# Patient Record
Sex: Female | Born: 1985 | Race: Black or African American | Hispanic: No | Marital: Single | Smoking: Former smoker
Health system: Southern US, Community
[De-identification: ages and names within clinical notes are randomized; demographics above are authoritative.]

## PROBLEM LIST (undated history)

## (undated) HISTORY — PX: BREAST SURGERY: SHX581

---
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
## 2007-11-25 ENCOUNTER — Emergency Department: Payer: Self-pay | Admitting: Emergency Medicine

## 2010-07-05 ENCOUNTER — Emergency Department: Payer: Self-pay | Admitting: Emergency Medicine

## 2010-11-12 ENCOUNTER — Emergency Department: Payer: Self-pay | Admitting: Emergency Medicine

## 2011-04-18 ENCOUNTER — Observation Stay: Payer: Self-pay

## 2011-07-13 ENCOUNTER — Emergency Department: Payer: Self-pay | Admitting: *Deleted

## 2012-03-10 ENCOUNTER — Emergency Department: Payer: Self-pay | Admitting: Internal Medicine

## 2012-09-12 ENCOUNTER — Emergency Department: Payer: Self-pay

## 2012-09-12 LAB — URINALYSIS, COMPLETE
Bacteria: NONE SEEN
Bilirubin,UR: NEGATIVE
Blood: NEGATIVE
Ketone: NEGATIVE
Leukocyte Esterase: NEGATIVE
Ph: 6 (ref 4.5–8.0)
Protein: NEGATIVE
RBC,UR: 1 /HPF (ref 0–5)
Specific Gravity: 1.025 (ref 1.003–1.030)
Squamous Epithelial: 1
WBC UR: 1 /HPF (ref 0–5)

## 2012-09-12 LAB — COMPREHENSIVE METABOLIC PANEL
Albumin: 3.9 g/dL (ref 3.4–5.0)
Alkaline Phosphatase: 77 U/L (ref 50–136)
Anion Gap: 7 (ref 7–16)
BUN: 7 mg/dL (ref 7–18)
Bilirubin,Total: 0.3 mg/dL (ref 0.2–1.0)
Calcium, Total: 8.5 mg/dL (ref 8.5–10.1)
Chloride: 106 mmol/L (ref 98–107)
Co2: 26 mmol/L (ref 21–32)
Creatinine: 0.84 mg/dL (ref 0.60–1.30)
EGFR (African American): >60
EGFR (Non-African Amer.): >60
Glucose: 89 mg/dL (ref 65–99)
Osmolality: 275 (ref 275–301)
Potassium: 3.4 mmol/L — ABNORMAL LOW (ref 3.5–5.1)
SGOT(AST): 17 U/L (ref 15–37)
SGPT (ALT): 16 U/L (ref 12–78)
Sodium: 139 mmol/L (ref 136–145)
Total Protein: 7.8 g/dL (ref 6.4–8.2)

## 2012-09-12 LAB — WET PREP, GENITAL

## 2012-09-12 LAB — CBC
HCT: 35.7 % (ref 35.0–47.0)
HGB: 11.5 g/dL — ABNORMAL LOW (ref 12.0–16.0)
MCH: 27.1 pg (ref 26.0–34.0)
MCHC: 32.3 g/dL (ref 32.0–36.0)
MCV: 84 fL (ref 80–100)
Platelet: 294 10*3/uL (ref 150–440)
RBC: 4.25 10*6/uL (ref 3.80–5.20)
RDW: 13.1 % (ref 11.5–14.5)
WBC: 5.1 10*3/uL (ref 3.6–11.0)

## 2012-09-12 LAB — LIPASE, BLOOD: Lipase: 93 U/L (ref 73–393)

## 2012-11-05 ENCOUNTER — Emergency Department: Payer: Self-pay | Admitting: Emergency Medicine

## 2013-02-27 ENCOUNTER — Emergency Department: Payer: Self-pay | Admitting: Emergency Medicine

## 2013-02-27 LAB — URINALYSIS, COMPLETE
Glucose,UR: NEGATIVE mg/dL (ref 0–75)
Leukocyte Esterase: NEGATIVE
Nitrite: NEGATIVE
Ph: 7 (ref 4.5–8.0)
Protein: NEGATIVE
Specific Gravity: 1.021 (ref 1.003–1.030)
Squamous Epithelial: 2
WBC UR: 1 /HPF (ref 0–5)

## 2013-02-27 LAB — BASIC METABOLIC PANEL
Anion Gap: 8 (ref 7–16)
BUN: 8 mg/dL (ref 7–18)
Chloride: 104 mmol/L (ref 98–107)
Co2: 26 mmol/L (ref 21–32)
Creatinine: 0.78 mg/dL (ref 0.60–1.30)
EGFR (African American): >60
EGFR (Non-African Amer.): >60
Osmolality: 273 (ref 275–301)
Potassium: 3.1 mmol/L — ABNORMAL LOW (ref 3.5–5.1)
Sodium: 138 mmol/L (ref 136–145)

## 2013-02-27 LAB — CBC
HCT: 35.8 % (ref 35.0–47.0)
HGB: 11.9 g/dL — ABNORMAL LOW (ref 12.0–16.0)
MCHC: 33.3 g/dL (ref 32.0–36.0)
RBC: 4.27 10*6/uL (ref 3.80–5.20)
RDW: 12.9 % (ref 11.5–14.5)

## 2013-02-27 LAB — PREGNANCY, URINE: Pregnancy Test, Urine: POSITIVE m[IU]/mL

## 2013-02-28 LAB — WET PREP, GENITAL

## 2013-02-28 LAB — GC/CHLAMYDIA PROBE AMP

## 2014-03-24 ENCOUNTER — Ambulatory Visit: Payer: Self-pay

## 2015-02-10 ENCOUNTER — Emergency Department
Admission: EM | Admit: 2015-02-10 | Discharge: 2015-02-11 | Disposition: A | Discharge status: Home / Self Care | Payer: BLUE CROSS/BLUE SHIELD | Attending: Emergency Medicine | Admitting: Emergency Medicine

## 2015-02-10 ENCOUNTER — Emergency Department: Payer: Self-pay

## 2015-02-10 DIAGNOSIS — N938 Other specified abnormal uterine and vaginal bleeding: Secondary | ICD-10-CM | POA: Insufficient documentation

## 2015-02-10 DIAGNOSIS — Z8742 Personal history of other diseases of the female genital tract: Secondary | ICD-10-CM

## 2015-02-10 DIAGNOSIS — N939 Abnormal uterine and vaginal bleeding, unspecified: Secondary | ICD-10-CM

## 2015-02-10 DIAGNOSIS — Z3202 Encounter for pregnancy test, result negative: Secondary | ICD-10-CM | POA: Insufficient documentation

## 2015-02-10 LAB — BASIC METABOLIC PANEL
ANION GAP: 8 (ref 5–15)
BUN: 12 mg/dL (ref 6–20)
CALCIUM: 8.5 mg/dL — AB (ref 8.9–10.3)
CHLORIDE: 105 mmol/L (ref 101–111)
CO2: 26 mmol/L (ref 22–32)
Creatinine, Ser: 0.82 mg/dL (ref 0.44–1.00)
GFR calc Af Amer: >60 mL/min (ref >60)
GFR calc non Af Amer: >60 mL/min (ref >60)
Glucose, Bld: 70 mg/dL (ref 65–99)
Potassium: 3.5 mmol/L (ref 3.5–5.1)
Sodium: 139 mmol/L (ref 135–145)

## 2015-02-10 LAB — URINALYSIS COMPLETE WITH MICROSCOPIC (ARMC ONLY)
Bacteria, UA: NONE SEEN
Bilirubin Urine: NEGATIVE
Glucose, UA: NEGATIVE mg/dL
Hgb urine dipstick: NEGATIVE
KETONES UR: NEGATIVE mg/dL
Nitrite: NEGATIVE
PH: 6 (ref 5.0–8.0)
Protein, ur: NEGATIVE mg/dL
Specific Gravity, Urine: 1.027 (ref 1.005–1.030)

## 2015-02-10 LAB — CBC
HCT: 38.6 % (ref 35.0–47.0)
HEMOGLOBIN: 12.8 g/dL (ref 12.0–16.0)
MCH: 27.9 pg (ref 26.0–34.0)
MCHC: 33.1 g/dL (ref 32.0–36.0)
MCV: 84.3 fL (ref 80.0–100.0)
PLATELETS: 275 10*3/uL (ref 150–440)
RBC: 4.58 MIL/uL (ref 3.80–5.20)
RDW: 14.2 % (ref 11.5–14.5)
WBC: 7 10*3/uL (ref 3.6–11.0)

## 2015-02-10 LAB — PREGNANCY, URINE: Preg Test, Ur: NEGATIVE

## 2015-02-10 NOTE — ED Notes (Signed)
PT in with abnormal vaginal spotting for 1 month and became heavier 1.5 weeks ago with lower abd cramping.  Pt is currently on depo, no dysuria.

## 2015-02-11 MED ORDER — HYDROCODONE-ACETAMINOPHEN 5-325 MG PO TABS
ORAL_TABLET | ORAL | Status: AC
  Administered 2015-02-11: 1 via ORAL
  Filled 2015-02-11: qty 1

## 2015-02-11 MED ORDER — HYDROCODONE-ACETAMINOPHEN 5-325 MG PO TABS: 1.0000 | ORAL_TABLET | Freq: Four times a day (QID) | ORAL | Status: AC | PRN

## 2015-02-11 MED ORDER — HYDROCODONE-ACETAMINOPHEN 5-325 MG PO TABS
1.0000 | ORAL_TABLET | Freq: Once | ORAL | Status: AC
  Administered 2015-02-11: 1 via ORAL

## 2015-02-11 MED ORDER — IBUPROFEN 600 MG PO TABS: 600.0000 mg | ORAL_TABLET | Freq: Three times a day (TID) | ORAL | Status: AC | PRN

## 2015-02-11 NOTE — Discharge Instructions (Signed)
1. Take pain medicines as needed (Motrin/Norco #15). 2. Drink plenty of fluids daily. 3. Return to the ER for worsening symptoms, soaking more than 1 pad every hour, fainting or other concerns.  Dysfunctional Uterine Bleeding Normally, menstrual periods begin between ages 33 to 72 in young women. A normal menstrual cycle/period may begin every 23 days up to 35 days and lasts from 1 to 7 days. Around 12 to 14 days before your menstrual period starts, ovulation (ovary produces an egg) occurs. When counting the time between menstrual periods, count from the first day of bleeding of the previous period to the first day of bleeding of the next period. Dysfunctional (abnormal) uterine bleeding is bleeding that is different from a normal menstrual period. Your periods may come earlier or later than usual. They may be lighter, have blood clots or be heavier. You may have bleeding between periods, or you may skip one period or more. You may have bleeding after sexual intercourse, bleeding after menopause, or no menstrual period. CAUSES   Pregnancy (normal, miscarriage, tubal).  IUDs (intrauterine device, birth control).  Birth control pills.  Hormone treatment.  Menopause.  Infection of the cervix.  Blood clotting problems.  Infection of the inside lining of the uterus.  Endometriosis, inside lining of the uterus growing in the pelvis and other female organs.  Adhesions (scar tissue) inside the uterus.  Obesity or severe weight loss.  Uterine polyps inside the uterus.  Cancer of the vagina, cervix, or uterus.  Ovarian cysts or polycystic ovary syndrome.  Medical problems (diabetes, thyroid disease).  Uterine fibroids (noncancerous tumor).  Problems with your female hormones.  Endometrial hyperplasia, very thick lining and enlarged cells inside of the uterus.  Medicines that interfere with ovulation.  Radiation to the pelvis or abdomen.  Chemotherapy. DIAGNOSIS   Your doctor  will discuss the history of your menstrual periods, medicines you are taking, changes in your weight, stress in your life, and any medical problems you may have.  Your doctor will do a physical and pelvic examination.  Your doctor may want to perform certain tests to make a diagnosis, such as:  Pap test.  Blood tests.  Cultures for infection.  CT scan.  Ultrasound.  Hysteroscopy.  Laparoscopy.  MRI.  Hysterosalpingography.  D and C.  Endometrial biopsy. TREATMENT  Treatment will depend on the cause of the dysfunctional uterine bleeding (DUB). Treatment may include:  Observing your menstrual periods for a couple of months.  Prescribing medicines for medical problems, including:  Antibiotics.  Hormones.  Birth control pills.  Removing an IUD (intrauterine device, birth control).  Surgery:  D and C (scrape and remove tissue from inside the uterus).  Laparoscopy (examine inside the abdomen with a lighted tube).  Uterine ablation (destroy lining of the uterus with electrical current, laser, heat, or freezing).  Hysteroscopy (examine cervix and uterus with a lighted tube).  Hysterectomy (remove the uterus). HOME CARE INSTRUCTIONS   If medicines were prescribed, take exactly as directed. Do not change or switch medicines without consulting your caregiver.  Long term heavy bleeding may result in iron deficiency. Your caregiver may have prescribed iron pills. They help replace the iron that your body lost from heavy bleeding. Take exactly as directed.  Do not take aspirin or medicines that contain aspirin one week before or during your menstrual period. Aspirin may make the bleeding worse.  If you need to change your sanitary pad or tampon more than once every 2 hours, stay in bed  with your feet elevated and a cold pack on your lower abdomen. Rest as much as possible, until the bleeding stops or slows down.  Eat well-balanced meals. Eat foods high in iron.  Examples are:  Leafy green vegetables.  Whole-grain breads and cereals.  Eggs.  Meat.  Liver.  Do not try to lose weight until the abnormal bleeding has stopped and your blood iron level is back to normal. Do not lift more than ten pounds or do strenuous activities when you are bleeding.  For a couple of months, make note on your calendar, marking the start and ending of your period, and the type of bleeding (light, medium, heavy, spotting, clots or missed periods). This is for your caregiver to better evaluate your problem. SEEK MEDICAL CARE IF:   You develop nausea (feeling sick to your stomach) and vomiting, dizziness, or diarrhea while you are taking your medicine.  You are getting lightheaded or weak.  You have any problems that may be related to the medicine you are taking.  You develop pain with your DUB.  You want to remove your IUD.  You want to stop or change your birth control pills or hormones.  You have any type of abnormal bleeding mentioned above.  You are over 26 years old and have not had a menstrual period yet.  You are 29 years old and you are still having menstrual periods.  You have any of the symptoms mentioned above.  You develop a rash. SEEK IMMEDIATE MEDICAL CARE IF:   An oral temperature above 102 F (38.9 C) develops.  You develop chills.  You are changing your sanitary pad or tampon more than once an hour.  You develop abdominal pain.  You pass out or faint. Document Released: 09/09/2000 Document Revised: 12/05/2011 Document Reviewed: 08/11/2009 [facility] Patient Information 2015 [place], [place]. This information is not intended to replace advice given to you by your health care provider. Make sure you discuss any questions you have with your health care provider.

## 2015-02-11 NOTE — ED Notes (Signed)
Patient with no complaints at this time. Respirations even and unlabored. Skin warm/dry. Discharge instructions reviewed with patient at this time. Patient given opportunity to voice concerns/ask questions. Patient discharged at this time and left Emergency Department with steady gait.   

## 2015-02-11 NOTE — ED Notes (Signed)
MD Beather Arbour at bedside with ED Tech, completing pelvic visual.

## 2015-02-11 NOTE — ED Notes (Addendum)
Vaginal bleeding since last month, with heavy amounts during the last week. Pt states she has received last Depo shot in April. Pt denies sx of pregnancy. Pt denies trauma/injury to area. Pt is alert and oriented x4.

## 2015-02-11 NOTE — ED Provider Notes (Signed)
[facility] Emergency Department Provider Note  ____________________________________________  Time seen: Approximately 0145AM  I have reviewed the triage vital signs and the nursing notes.   HISTORY  Chief Complaint Vaginal Bleeding    HPI Shawna Kelley is a 29 y.o. female who presents with a one-month history of intermittent vaginal bleeding; most recently heavier bleeding 1.5 weeks. Patient has been on Depo-Provera shots since October 2015; last Depo shot at the end of April.Patient complains of 9/10 pelvic cramping. Denies lightheadedness, dizziness, chest pain, shortness of breath, vaginal discharge. Last sexual intercourse one week ago.  Past medical history No history of fibroids, endometriosis or ovarian cysts  No past surgical history on file.  Current Outpatient Rx  Name  Route  Sig  Dispense  Refill  . HYDROcodone-acetaminophen (NORCO) 5-325 MG per tablet   Oral   Take 1 tablet by mouth every 6 (six) hours as needed for moderate pain.   15 tablet   0   . ibuprofen (ADVIL,MOTRIN) 600 MG tablet   Oral   Take 1 tablet (600 mg total) by mouth every 8 (eight) hours as needed.   15 tablet   0     Allergies Review of patient's allergies indicates no known allergies.  Family history Mother with history of ovarian cysts  Social History History  Substance Use Topics  . Smoking status: Not on file  . Smokeless tobacco: Not on file  . Alcohol Use: Not on file   denies recreational drug use  Review of Systems Constitutional: No fever/chills Eyes: No visual changes. ENT: No sore throat. Cardiovascular: Denies chest pain. Respiratory: Denies shortness of breath. Gastrointestinal: No abdominal pain.  No nausea, no vomiting.  No diarrhea.  No constipation. Genitourinary: Negative for dysuria. Positive for vaginal bleeding. Musculoskeletal: Negative for back pain. Skin: Negative for rash. Neurological: Negative for headaches, focal  weakness or numbness.  10-point ROS otherwise negative.  ____________________________________________   PHYSICAL EXAM:  VITAL SIGNS: ED Triage Vitals  Enc Vitals Group     BP 02/10/15 1916 134/95 mmHg     Pulse Rate 02/10/15 1916 97     Resp 02/10/15 1916 18     Temp 02/10/15 1916 99 F (37.2 C)     Temp Source 02/10/15 1916 Oral     SpO2 02/10/15 1916 100 %     Weight 02/10/15 1916 158 lb (71.668 kg)     Height 02/10/15 1916 5\' 4"  (1.626 m)     Head Cir --      Peak Flow --      Pain Score 02/10/15 1917 9     Pain Loc --      Pain Edu? --      Excl. in [place]? --     Constitutional: Alert and oriented. Well appearing and in no acute distress. Eyes: Conjunctivae are normal. PERRL. EOMI. Head: Atraumatic. Nose: No congestion/rhinnorhea. Mouth/Throat: Mucous membranes are moist.  Oropharynx non-erythematous. Neck: No stridor.   Cardiovascular: Normal rate, regular rhythm. Grossly normal heart sounds.  Good peripheral circulation. Respiratory: Normal respiratory effort.  No retractions. Lungs CTAB. Gastrointestinal: Soft and nontender. No distention. No abdominal bruits. No CVA tenderness. Genitourinary/pelvic exam: External exam normal, mild active bleeding, no cervical motion tenderness, no cervical dilation, normal bimanual exam. Musculoskeletal: No lower extremity tenderness nor edema.  No joint effusions. Neurologic:  Normal speech and language. No gross focal neurologic deficits are appreciated. Speech is normal. No gait instability. Skin:  Skin is warm, dry and intact. No  rash noted. Psychiatric: Mood and affect are normal. Speech and behavior are normal.  ____________________________________________   LABS (all labs ordered are listed, but only abnormal results are displayed)  Labs Reviewed  BASIC METABOLIC PANEL - Abnormal; Notable for the following:    Calcium 8.5 (*)    All other components within normal limits  URINALYSIS COMPLETEWITH MICROSCOPIC ([facility])  -  Abnormal; Notable for the following:    Color, Urine YELLOW (*)    APPearance CLEAR (*)    Leukocytes, UA TRACE (*)    Squamous Epithelial / LPF 0-5 (*)    All other components within normal limits  URINE CULTURE  CBC  PREGNANCY, URINE   ____________________________________________  EKG  None ____________________________________________  RADIOLOGY  Transabdominal and transvaginal ultrasound of pelvis interpreted by Dr. Radene Knee:  Unremarkable pelvic ultrasound. No evidence for ovarian torsion.  ____________________________________________   PROCEDURES  Procedure(s) performed: None  Critical Care performed: No  ____________________________________________   INITIAL IMPRESSION / ASSESSMENT AND PLAN / ED COURSE  Pertinent labs & imaging results that were available during my care of the patient were reviewed by me and considered in my medical decision making (see chart for details).  29 year old female with dysfunctional uterine bleeding. As last Depo shot was approximately one month ago, will hold progesterone at this time, analgesics for pelvic cramping and follow-up GYN. Will add a urine culture; hold antibiotics as patient is asymptomatic. Strict return precautions given. Patient verbalizes understanding and agrees with plan. ____________________________________________   FINAL CLINICAL IMPRESSION(S) / ED DIAGNOSES  Final diagnoses:  Dysfunctional uterine bleeding      Paulette Blanch, MD 02/11/15 [phone]

## 2015-02-24 ENCOUNTER — Telehealth: Payer: Self-pay | Admitting: Emergency Medicine

## 2015-02-24 NOTE — ED Notes (Signed)
Pt called asking about rx for uti?  Says doctor told her she had uti.  Chart reviewed and explained to patient that md did not want to treat for uti with no sx.  She was going to wait for urine culture to result.  I could not see where a urine culture was done, and notified the patient.  I told her that if she became symptomatic she should see pcp.  She said it may be 2 weeks because she just started a new job, but she will work on being seen sooner.

## 2017-10-18 ENCOUNTER — Other Ambulatory Visit: Payer: Self-pay | Admitting: Orthopedic Surgery

## 2017-10-18 DIAGNOSIS — D492 Neoplasm of unspecified behavior of bone, soft tissue, and skin: Secondary | ICD-10-CM

## 2017-10-20 ENCOUNTER — Encounter: Payer: Self-pay | Admitting: Radiology

## 2017-10-20 ENCOUNTER — Ambulatory Visit
Admission: RE | Admit: 2017-10-20 | Discharge: 2017-10-20 | Disposition: A | Discharge status: Home / Self Care | Payer: BLUE CROSS/BLUE SHIELD | Source: Ambulatory Visit | Attending: Orthopedic Surgery | Admitting: Orthopedic Surgery

## 2017-10-20 DIAGNOSIS — M5144 Schmorl's nodes, thoracic region: Secondary | ICD-10-CM | POA: Insufficient documentation

## 2017-10-20 DIAGNOSIS — M4184 Other forms of scoliosis, thoracic region: Secondary | ICD-10-CM | POA: Insufficient documentation

## 2017-10-20 DIAGNOSIS — D492 Neoplasm of unspecified behavior of bone, soft tissue, and skin: Secondary | ICD-10-CM | POA: Insufficient documentation

## 2017-10-20 MED ORDER — GADOBENATE DIMEGLUMINE 529 MG/ML IV SOLN
15.0000 mL | Freq: Once | INTRAVENOUS | Status: AC | PRN
  Administered 2017-10-20: 14 mL via INTRAVENOUS

## 2018-09-19 IMAGING — MR MR THORACIC SPINE WO/W CM
7 of 9 series · 34 of 48 positions shown · IV contrast (multihance)
Comparison: None available.

CLINICAL DATA: Initial evaluation for constant mid back pain status
post fall in August 2017.

EXAM:
MRI THORACIC WITHOUT AND WITH CONTRAST
TECHNIQUE: Multiplanar and multiecho pulse sequences of the thoracic spine were
obtained without and with intravenous contrast.
CONTRAST:  14mL MULTIHANCE GADOBENATE DIMEGLUMINE 529 MG/ML IV SOLN

[Series 4: T2 · sagittal · 4.0mm · 0.97mm/px · 2 of 13 slices shown (1 of 2)]
[im 1/13]
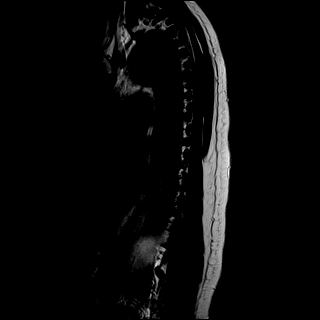
[im 13/13]
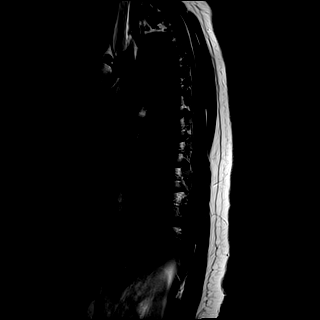

[Series 5: T1 · sagittal · 4.0mm · 0.97mm/px · 3 of 13 slices shown (1 of 2)]
[im 1/13]
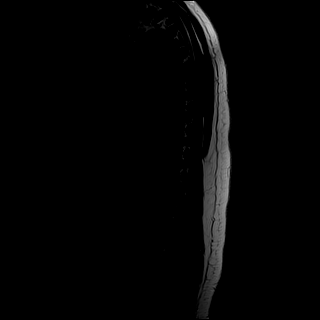
[im 7/13]
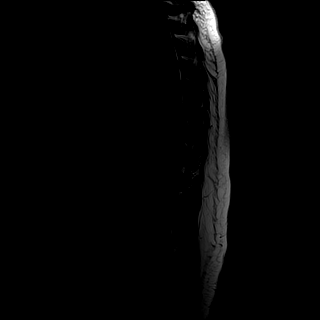
[im 13/13]
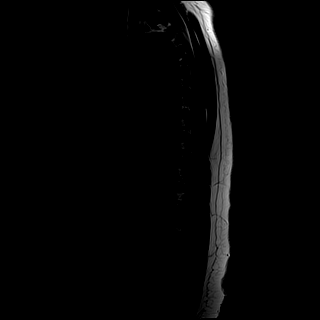

[Series 6: STIR · sagittal · 4.0mm · 1.21mm/px · 3 of 13 slices shown]
[im 1/13]
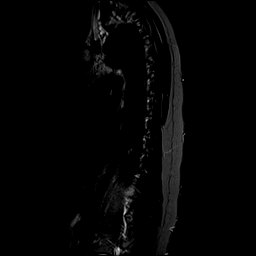
[im 7/13]
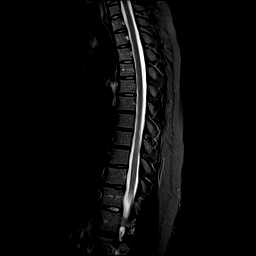
[im 13/13]
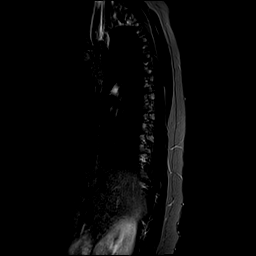

[Series 7: T2 · axial · 5.0mm · 0.86mm/px · z∈[-158,+73]mm · 9 of 39 slices shown (2 of 2)]
[im 1/39]
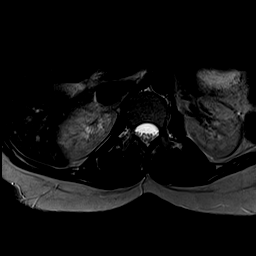
[im 5/39]
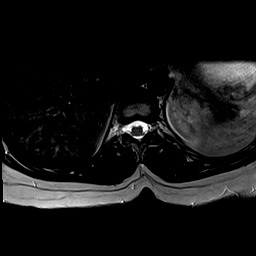
[im 10/39]
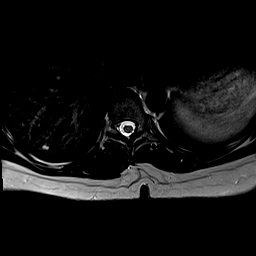
[im 15/39]
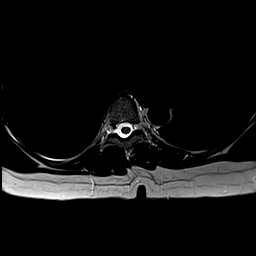
[im 20/39]
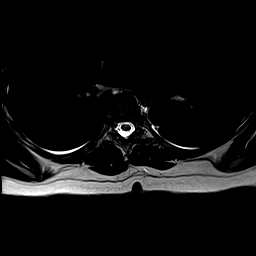
[im 24/39]
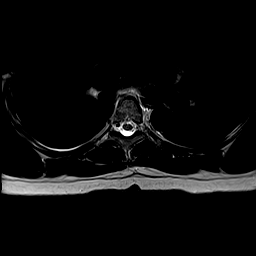
[im 29/39]
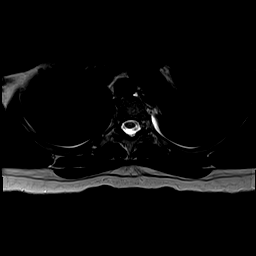
[im 34/39]
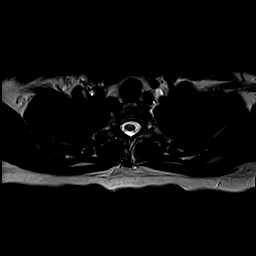
[im 39/39]
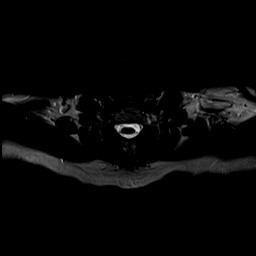

[Series 9: T1 · axial · non-contrast · 5.0mm · 0.43mm/px · z∈[-162,+78]mm · 9 of 39 slices shown (2 of 2)]
[im 1/39]
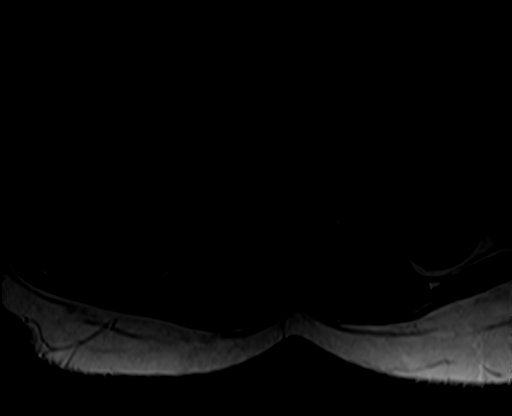
[im 5/39]
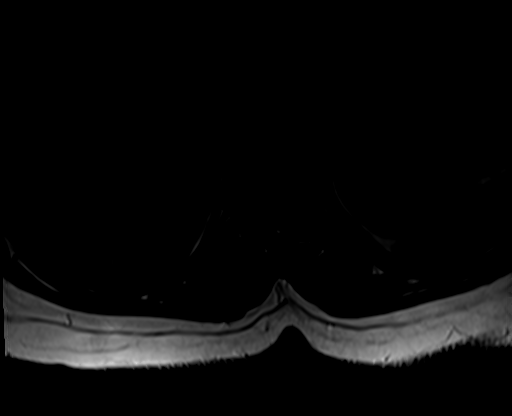
[im 10/39]
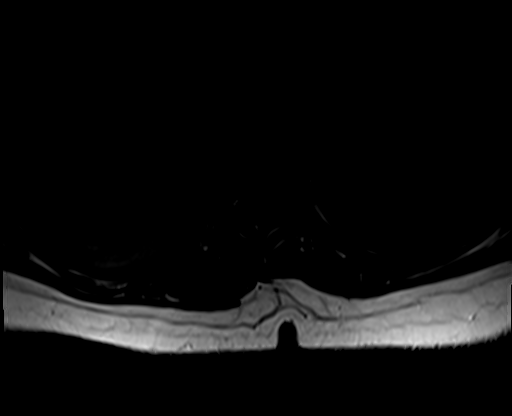
[im 15/39]
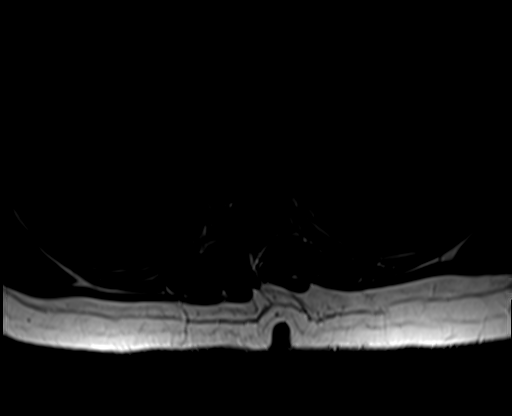
[im 20/39]
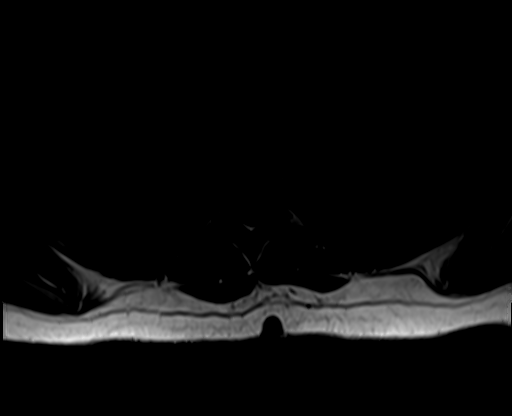
[im 24/39]
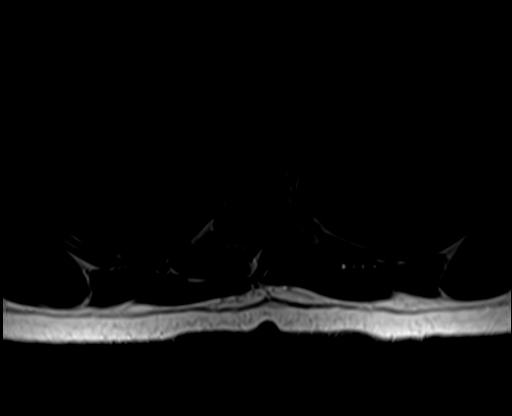
[im 29/39]
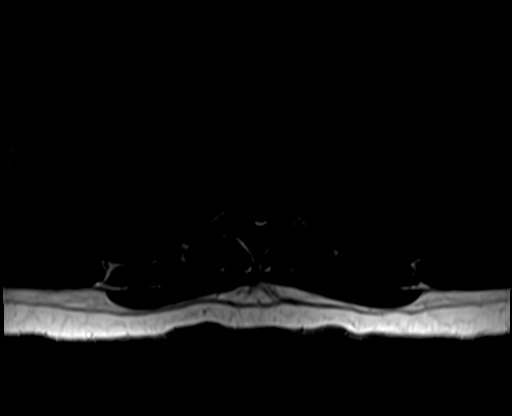
[im 34/39]
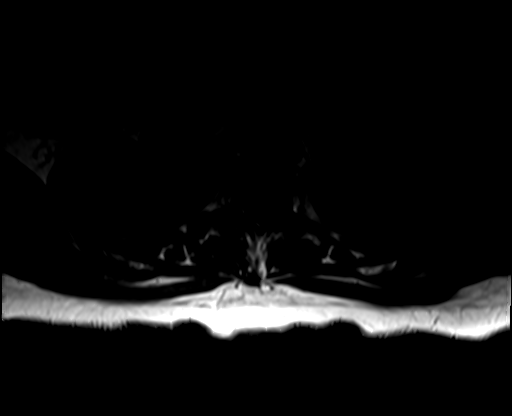
[im 39/39]
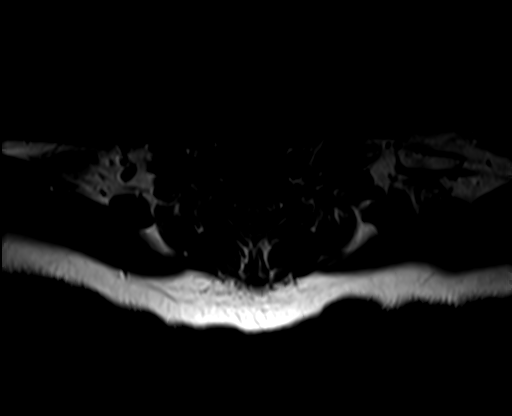

[Series 10: T1 fat-sat post-contrast · sagittal · 4.0mm · 0.97mm/px · 3 of 13 slices shown]
[im 1/13]
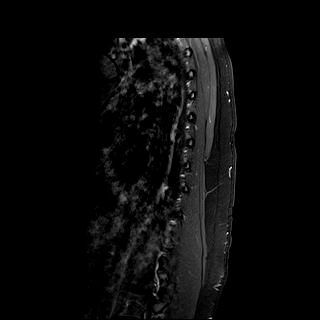
[im 7/13]
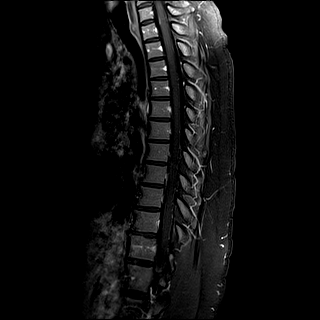
[im 13/13]
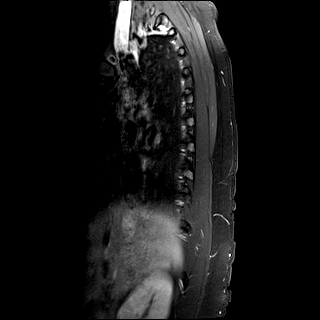

[Series 11: T1 post-contrast · axial · 5.0mm · 0.43mm/px · z∈[-162,+6]mm · 5 of 39 slices shown]
[im 1/39]
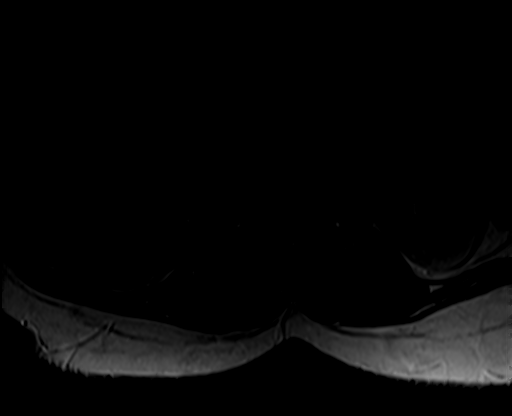
[im 5/39]
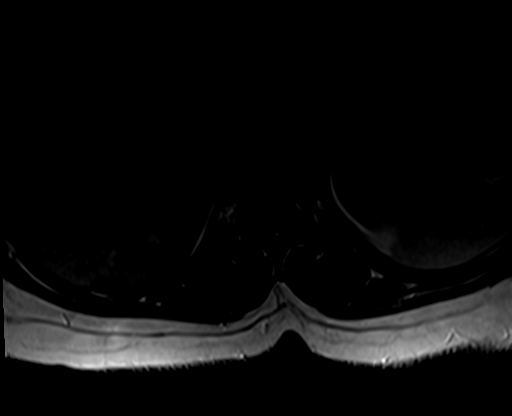
[im 10/39]
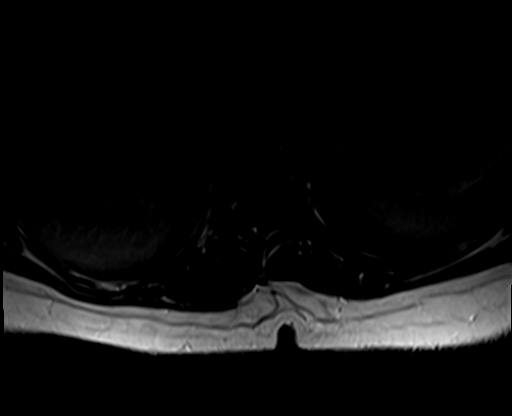
[im 15/39]
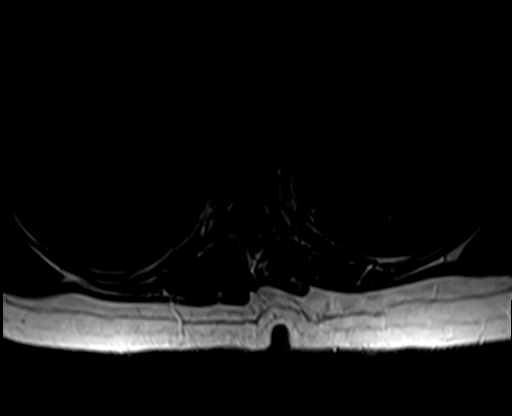
[im 24/39]
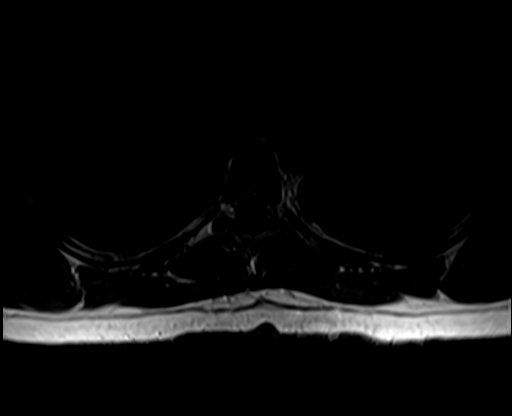

[34 of 48 positions shown; findings below may reference images not displayed]

FINDINGS: MRI THORACIC SPINE FINDINGS

Alignment: Scoliosis with mild straightening of the normal thoracic
kyphosis. No listhesis or subluxation.

Vertebrae: Prominent degenerative endplate Schmorl's node present at
the left posterior aspect of the T11 vertebral body (series 4, image
9). Associated height loss of up to approximately 50%. Cystic
invagination within the adjacent T11 vertebral body. Faint blush of
enhancement without significant reactive marrow edema. This is
likely a chronic abnormality.

Vertebral body heights otherwise maintained without evidence for
acute or chronic fracture. Bone marrow signal intensity within
normal limits. No discrete or worrisome osseous lesions. No other
abnormal enhancement.

Cord: Signal intensity within the cervical spinal cord is normal. No
abnormal enhancement. Cord caliber normal. Conus medullaris
terminates at the T12-L1 level.

Paraspinal and other soft tissues: Paraspinous soft tissues
demonstrate no acute abnormality. Few scattered subcentimeter
nonenhancing cyst noted within the right hepatic lobe. Visualized
visceral structures otherwise unremarkable. Partially visualized
lungs are clear.

Disc levels:

No significant disc pathology seen within the thoracic spine. No
disc bulge or disc protrusion. No significant facet disease. No
canal or neural foraminal stenosis.
IMPRESSION: 1. Prominent degenerative Schmorl's node at the superior endplate of
T11 with approximately 50% height loss. Finding favored to reflect a
chronic abnormality.
2. Mild dextroscoliosis.
3. Otherwise normal MRI of the thoracic spine. No significant disc
pathology. No canal or foraminal stenosis.

## 2020-07-12 ENCOUNTER — Other Ambulatory Visit: Payer: Self-pay

## 2020-07-12 ENCOUNTER — Ambulatory Visit
Admission: EM | Admit: 2020-07-12 | Discharge: 2020-07-12 | Disposition: A | Discharge status: Home / Self Care | Payer: BLUE CROSS/BLUE SHIELD | Attending: Physician Assistant | Admitting: Physician Assistant

## 2020-07-12 ENCOUNTER — Encounter: Payer: Self-pay | Admitting: Emergency Medicine

## 2020-07-12 DIAGNOSIS — R102 Pelvic and perineal pain: Secondary | ICD-10-CM | POA: Insufficient documentation

## 2020-07-12 DIAGNOSIS — R350 Frequency of micturition: Secondary | ICD-10-CM | POA: Insufficient documentation

## 2020-07-12 DIAGNOSIS — S60521A Blister (nonthermal) of right hand, initial encounter: Secondary | ICD-10-CM

## 2020-07-12 DIAGNOSIS — K5900 Constipation, unspecified: Secondary | ICD-10-CM | POA: Diagnosis not present

## 2020-07-12 LAB — URINALYSIS, COMPLETE (UACMP) WITH MICROSCOPIC
Bacteria, UA: NONE SEEN
Bilirubin Urine: NEGATIVE
Glucose, UA: NEGATIVE mg/dL
Hgb urine dipstick: NEGATIVE
Ketones, ur: NEGATIVE mg/dL
Leukocytes,Ua: NEGATIVE
Nitrite: NEGATIVE
Protein, ur: NEGATIVE mg/dL
Specific Gravity, Urine: 1.01 (ref 1.005–1.030)
Squamous Epithelial / LPF: NONE SEEN (ref 0–5)
pH: 6.5 (ref 5.0–8.0)

## 2020-07-12 LAB — PREGNANCY, URINE: Preg Test, Ur: NEGATIVE

## 2020-07-12 LAB — WET PREP, GENITAL
Clue Cells Wet Prep HPF POC: NONE SEEN
Trich, Wet Prep: NONE SEEN
Yeast Wet Prep HPF POC: NONE SEEN

## 2020-07-12 LAB — HCG, QUANTITATIVE, PREGNANCY: hCG, Beta Chain, Quant, S: <1 m[IU]/mL (ref <5)

## 2020-07-12 NOTE — ED Triage Notes (Signed)
Patient in today c/o urinary frequency, left sided flank pain and lower abdominal pain x 3 days. Patient denies fever. Patient does state that she has a slight vaginal discharge, no odor.  Patient requesting a pregnancy test today as well. She has not missed her period, but did a home test and saw a faint line.  Patient also c/o right ring finger pain x 1 week. Patient states that a lesion came up on her finger 1 week ago, getting larger.

## 2020-07-12 NOTE — ED Provider Notes (Signed)
[facility]    CSN: [id number] Arrival date & time: 07/12/20  1253      History   Chief Complaint Chief Complaint  Patient presents with  . Urinary Frequency  . Flank Pain  . Hand Pain    right ring finger    HPI Shawna Kelley is a 34 y.o. female presenting for 2-day history of suprapubic pressure which is associated with urinary frequency and mild lower back discomfort.  She states she is also had a slight vaginal discharge that she is not sure is abnormal or not.  Denies any odor, lesions or itching.  Denies any painful urination or hematuria.  Last menstrual period was 06/20/2020.  Patient states that she took a pregnancy test at home yesterday and the test was positive so she is concerned she be pregnant.  She admits to constipation and says last bowel movement was 3 to 4 days ago.  Admits mild associated nausea without vomiting.  No diarrhea or blood in stool.  No fevers.  Patient not concerned for STIs and declines any STI testing today.  She says the abdominal pain is mild at this time.  She has not taken anything for the symptoms.  Patient also complaining of blisters of the right hand between the third and fourth fingers for about a week.  She says that the blisters were very painful initially but seem to have gotten smaller and are not hurting anymore.  She denies any associated redness.  No pain with moving the fingers although she used to have pain with moving her fingers.  No drainage from the area.  Denies any contact with any known allergens.  She says she occasionally gets blisters that come and go on her hands over the past 6 months but has not been seen for it.  She has not applied anything to the skin differently and has not treated it in any way.  She has no other concerns today.  HPI  History reviewed. No pertinent past medical history.  There are no problems to display for this patient.   Past Surgical History:  Procedure Laterality Date  . BREAST  SURGERY Bilateral    lumpectomy  . CESAREAN SECTION     x3    OB History   No obstetric history on file.      Home Medications    Prior to Admission medications   Medication Sig Start Date End Date Taking? Authorizing Provider  HYDROcodone-acetaminophen (NORCO) 5-325 MG per tablet Take 1 tablet by mouth every 6 (six) hours as needed for moderate pain. 02/11/15   Paulette Blanch, MD  ibuprofen (ADVIL,MOTRIN) 600 MG tablet Take 1 tablet (600 mg total) by mouth every 8 (eight) hours as needed. 02/11/15   Paulette Blanch, MD    Family History Family History  Problem Relation Age of Onset  . Healthy Mother   . Eczema Father   . Hypertension Father     Social History Social History   Tobacco Use  . Smoking status: Former Smoker    Years: 1.00    Types: Cigars    Quit date: 09/2019    Years since quitting: 0.7  . Smokeless tobacco: Never Used  . Tobacco comment: 1 black and mild daily  Vaping Use  . Vaping Use: Never used  Substance Use Topics  . Alcohol use: Never  . Drug use: Never     Allergies   Patient has no known allergies.   Review of Systems Review  of Systems  Constitutional: Negative for chills and fever.  Gastrointestinal: Positive for abdominal pain. Negative for diarrhea, nausea and vomiting.  Genitourinary: Positive for frequency. Negative for decreased urine volume, dysuria, flank pain, hematuria, pelvic pain, urgency, vaginal bleeding, vaginal discharge and vaginal pain.  Musculoskeletal: Positive for back pain.  Skin: Negative for rash.       Skin blisters     Physical Exam Triage Vital Signs ED Triage Vitals  Enc Vitals Group     BP 07/12/20 1322 (!) 152/109     Pulse Rate 07/12/20 1322 93     Resp 07/12/20 1322 18     Temp 07/12/20 1322 98.5 F (36.9 C)     Temp Source 07/12/20 1322 Oral     SpO2 07/12/20 1322 100 %     Weight 07/12/20 1323 160 lb (72.6 kg)     Height 07/12/20 1323 5\' 4"  (1.626 m)     Head Circumference --      Peak Flow  --      Pain Score 07/12/20 1323 7     Pain Loc --      Pain Edu? --      Excl. in [place]? --    No data found.  Updated Vital Signs BP (!) 158/111 (BP Location: Right Arm)   Pulse 93   Temp 98.5 F (36.9 C) (Oral)   Resp 18   Ht 5\' 4"  (1.626 m)   Wt 160 lb (72.6 kg)   LMP 06/20/2020 (Exact Date)   SpO2 100%   BMI 27.46 kg/m      Physical Exam Vitals and nursing note reviewed.  Constitutional:      General: She is not in acute distress.    Appearance: Normal appearance. She is not ill-appearing or toxic-appearing.  HENT:     Head: Normocephalic and atraumatic.     Nose: Nose normal.     Mouth/Throat:     Mouth: Mucous membranes are moist.     Pharynx: Oropharynx is clear.  Eyes:     General: No scleral icterus.       Right eye: No discharge.        Left eye: No discharge.     Conjunctiva/sclera: Conjunctivae normal.  Cardiovascular:     Rate and Rhythm: Normal rate and regular rhythm.     Heart sounds: Normal heart sounds.  Pulmonary:     Effort: Pulmonary effort is normal. No respiratory distress.     Breath sounds: Normal breath sounds.  Abdominal:     Palpations: Abdomen is soft.     Tenderness: There is no abdominal tenderness. There is no right CVA tenderness or left CVA tenderness.  Musculoskeletal:     Cervical back: Neck supple.  Skin:    General: Skin is dry.     Comments: Multiple scattered skin colored vesicles between third and fourth digits at the finger web.  Area is nontender.  No erythema or warmth.  No swelling of digits.  Neurological:     General: No focal deficit present.     Mental Status: She is alert. Mental status is at baseline.     Motor: No weakness.     Gait: Gait normal.  Psychiatric:        Mood and Affect: Mood normal.        Behavior: Behavior normal.        Thought Content: Thought content normal.      UC Treatments / Results  Labs (all labs  ordered are listed, but only abnormal results are displayed) Labs Reviewed  WET  PREP, GENITAL - Abnormal; Notable for the following components:      Result Value   WBC, Wet Prep HPF POC FEW (*)    All other components within normal limits  URINE CULTURE  URINALYSIS, COMPLETE (UACMP) WITH MICROSCOPIC  PREGNANCY, URINE  HCG, QUANTITATIVE, PREGNANCY    EKG   Radiology No results found.  Procedures Procedures (including critical care time)  Medications Ordered in UC Medications - No data to display  Initial Impression / Assessment and Plan / UC Course  I have reviewed the triage vital signs and the nursing notes.  Pertinent labs & imaging results that were available during my care of the patient were reviewed by me and considered in my medical decision making (see chart for details).   On exam patient does not have any abdominal tenderness or CVA tenderness.  Urinalysis is within normal limits.  Urine pregnancy test negative.  Wet prep within normal limits.  Urine sent for culture but unlikely UTI given that she does not have any abnormal findings on urinalysis and there is no dysuria.  Patient declined any STI testing today.  Patient still concerned she possibly could be pregnant since she says she might of had at home positive test.  hCG serum drawn.  Patient is a constipation I explained that is likely the cause of her abdominal discomfort.  Advised her to take the MiraLAX increase fluids.  ED precautions discussed for any worsening symptoms especially abdominal pain.  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  Advised hydrocortisone cream.  Follow-up with PCP or dermatology if this continues.   Final Clinical Impressions(s) / UC Diagnoses   Final diagnoses:  Suprapubic pain  Constipation, unspecified constipation type  Urinary frequency  Blister of right hand, initial encounter     Discharge Instructions     A urinalysis and vaginal swab are negative for any signs of urinary tract infection or vaginal infection.  You have declined STI  testing today.  We will send the urine for culture and treat for UTI if there is bacterial growth.  Urine pregnancy test was negative, but we have drawn your blood to assess further for pregnancy since you had a positive at home test.  Your abdominal pain could be due to constipation.  I would advise you to increase your fluid intake and consider taking MiraLAX.  ABDOMINAL PAIN: You may take Tylenol for pain relief. Use medications as directed including antiemetics and antidiarrheal medications if suggested or prescribed. You should increase fluids and electrolytes as well as rest over these next several days. If you have any questions or concerns, or if your symptoms are not improving or if especially if they acutely worsen, please call or stop back to the clinic immediately and we will be happy to help you or go to the ER   ABDOMINAL PAIN RED FLAGS: Seek immediate further care if: symptoms remain the same or worsen over the next 3-7 days, you are unable to keep fluids down, you see blood or mucus in your stool, you vomit black or dark red material, you have a fever of 101.F or higher, you have localized and/or persistent abdominal pain    The blisters on your hand appear to be due to some type of dermatitis.  Try not to pop the blisters as the likely resolve on their own.  If you notice that they are red or more  tender he should follow-up to be seen again to check for infection.  They do not appear to be infected at this time.  You can apply hydrocortisone cream to the area which might provide some relief.   ED Prescriptions    None     PDMP not reviewed this encounter.   Danton Clap, PA-C 07/14/20 1223

## 2020-07-12 NOTE — Discharge Instructions (Signed)
A urinalysis and vaginal swab are negative for any signs of urinary tract infection or vaginal infection.  You have declined STI testing today.  We will send the urine for culture and treat for UTI if there is bacterial growth.  Urine pregnancy test was negative, but we have drawn your blood to assess further for pregnancy since you had a positive at home test.  Your abdominal pain could be due to constipation.  I would advise you to increase your fluid intake and consider taking MiraLAX.  ABDOMINAL PAIN: You may take Tylenol for pain relief. Use medications as directed including antiemetics and antidiarrheal medications if suggested or prescribed. You should increase fluids and electrolytes as well as rest over these next several days. If you have any questions or concerns, or if your symptoms are not improving or if especially if they acutely worsen, please call or stop back to the clinic immediately and we will be happy to help you or go to the ER   ABDOMINAL PAIN RED FLAGS: Seek immediate further care if: symptoms remain the same or worsen over the next 3-7 days, you are unable to keep fluids down, you see blood or mucus in your stool, you vomit black or dark red material, you have a fever of 101.F or higher, you have localized and/or persistent abdominal pain    The blisters on your hand appear to be due to some type of dermatitis.  Try not to pop the blisters as the likely resolve on their own.  If you notice that they are red or more tender he should follow-up to be seen again to check for infection.  They do not appear to be infected at this time.  You can apply hydrocortisone cream to the area which might provide some relief.

## 2020-07-14 LAB — URINE CULTURE: Culture: NO GROWTH
